# Patient Record
Sex: Male | Born: 1955 | Race: White | Hispanic: No | Marital: Single | Smoking: Never smoker
Health system: Southern US, Community
[De-identification: ages and names within clinical notes are randomized; demographics above are authoritative.]

---

## 2014-02-26 ENCOUNTER — Encounter: Payer: Self-pay | Admitting: Sports Medicine

## 2014-02-26 ENCOUNTER — Ambulatory Visit (INDEPENDENT_AMBULATORY_CARE_PROVIDER_SITE_OTHER): Payer: BC Managed Care – PPO

## 2014-02-26 ENCOUNTER — Ambulatory Visit (INDEPENDENT_AMBULATORY_CARE_PROVIDER_SITE_OTHER): Payer: BC Managed Care – PPO | Admitting: Sports Medicine

## 2014-02-26 VITALS — BP 155/96 | HR 65 | Ht 70.0 in | Wt 223.0 lb

## 2014-02-26 DIAGNOSIS — M2021 Hallux rigidus, right foot: Secondary | ICD-10-CM

## 2014-02-26 DIAGNOSIS — M25673 Stiffness of unspecified ankle, not elsewhere classified: Secondary | ICD-10-CM

## 2014-02-26 DIAGNOSIS — M79609 Pain in unspecified limb: Secondary | ICD-10-CM

## 2014-02-26 DIAGNOSIS — M2010 Hallux valgus (acquired), unspecified foot: Secondary | ICD-10-CM

## 2014-02-26 DIAGNOSIS — M25676 Stiffness of unspecified foot, not elsewhere classified: Secondary | ICD-10-CM

## 2014-02-26 MED ORDER — MELOXICAM 15 MG PO TABS: ORAL_TABLET | ORAL | Status: AC

## 2014-02-26 NOTE — Progress Notes (Signed)
   Subjective:    I'm seeing this patient as a consultation for:  Dr. Marvis RepressStanley Schaeffer  CC: Right foot pain  HPI: This is a pleasant 58 year old male runner, he comes in with a several month history of pain that he localizes at the plantar aspect of the right first metatarsophalangeal joint. He has developed rigidity, swelling, and what he describes as bone spurs all around the first metatarsophalangeal joint. Pain is moderate, persistent, he's been using ibuprofen and other over-the-counter NSAIDs without any response. No trauma.  Past medical history, Surgical history, Family history not pertinant except as noted below, Social history, Allergies, and medications have been entered into the medical record, reviewed, and no changes needed.   Review of Systems: No headache, visual changes, nausea, vomiting, diarrhea, constipation, dizziness, abdominal pain, skin rash, fevers, chills, night sweats, weight loss, swollen lymph nodes, body aches, joint swelling, muscle aches, chest pain, shortness of breath, mood changes, visual or auditory hallucinations.   Objective:   General: Well Developed, well nourished, and in no acute distress.  Neuro/Psych: Alert and oriented x3, extra-ocular muscles intact, able to move all 4 extremities, sensation grossly intact. Skin: Warm and dry, no rashes noted.  Respiratory: Not using accessory muscles, speaking in full sentences, trachea midline.  Cardiovascular: Pulses palpable, no extremity edema. Abdomen: Does not appear distended. Right Foot: No visible erythema or swelling. Range of motion is full in all directions. Strength is 5/5 in all directions. No hallux valgus. No pes cavus or pes planus. No abnormal callus noted. No pain over the navicular prominence, or base of fifth metatarsal. No tenderness to palpation of the calcaneal insertion of plantar fascia. No pain at the Achilles insertion. No pain over the calcaneal bursa. No pain of the  retrocalcaneal bursa. Tender to palpation over the first metatarsophalangeal joint, there is reproduction of pain with flexion of the first metatarsophalangeal joint. Hallux rigidus present. No tenderness palpation over interphalangeal joints. No pain with compression of the metatarsal heads. Neurovascularly intact distally.  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  Impression and Recommendations:   This case required medical decision making of moderate complexity.

## 2014-02-26 NOTE — Assessment & Plan Note (Signed)
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 X-rays, Mobic, return for custom orthotics with first metatarsal ray posting. If no better in one month after that, we can inject.

## 2014-03-14 ENCOUNTER — Encounter: Payer: Self-pay | Admitting: Sports Medicine

## 2014-03-14 ENCOUNTER — Ambulatory Visit (INDEPENDENT_AMBULATORY_CARE_PROVIDER_SITE_OTHER): Payer: BC Managed Care – PPO | Admitting: Sports Medicine

## 2014-03-14 VITALS — BP 139/80 | HR 61 | Ht 70.0 in | Wt 221.0 lb

## 2014-03-14 DIAGNOSIS — M2021 Hallux rigidus, right foot: Secondary | ICD-10-CM

## 2014-03-14 DIAGNOSIS — M25673 Stiffness of unspecified ankle, not elsewhere classified: Secondary | ICD-10-CM

## 2014-03-14 DIAGNOSIS — M25676 Stiffness of unspecified foot, not elsewhere classified: Secondary | ICD-10-CM

## 2014-03-14 NOTE — Assessment & Plan Note (Signed)
Custom orthotics as above. Pain-free. Return as needed.

## 2014-03-14 NOTE — Progress Notes (Signed)
    Patient was fitted for a : standard, cushioned, semi-rigid orthotic. The orthotic was heated and afterward the patient stood on the orthotic blank positioned on the orthotic stand. The patient was positioned in subtalar neutral position and 10 degrees of ankle dorsiflexion in a weight bearing stance. After completion of molding, a stable base was applied to the orthotic blank. The blank was ground to a stable position for weight bearing. Size: 12 Base: Blue EVA Additional Posting and Padding:  First metatarsal ray post bilaterally. The patient ambulated these, and they were very comfortable.  I spent 40 minutes with this patient, greater than 50% was face-to-face time counseling regarding the below diagnosis.

## 2014-10-29 IMAGING — CR DG FOOT COMPLETE 3+V*R*
3 series · 3 of 3 positions shown · non-contrast
Comparison: None.

CLINICAL DATA: Pain plantar aspect first metatarsal phalangeal
joint space since running half marathon November 2013.

EXAM:
RIGHT FOOT COMPLETE - 3+ VIEW

[view not recorded (1 of 3)]
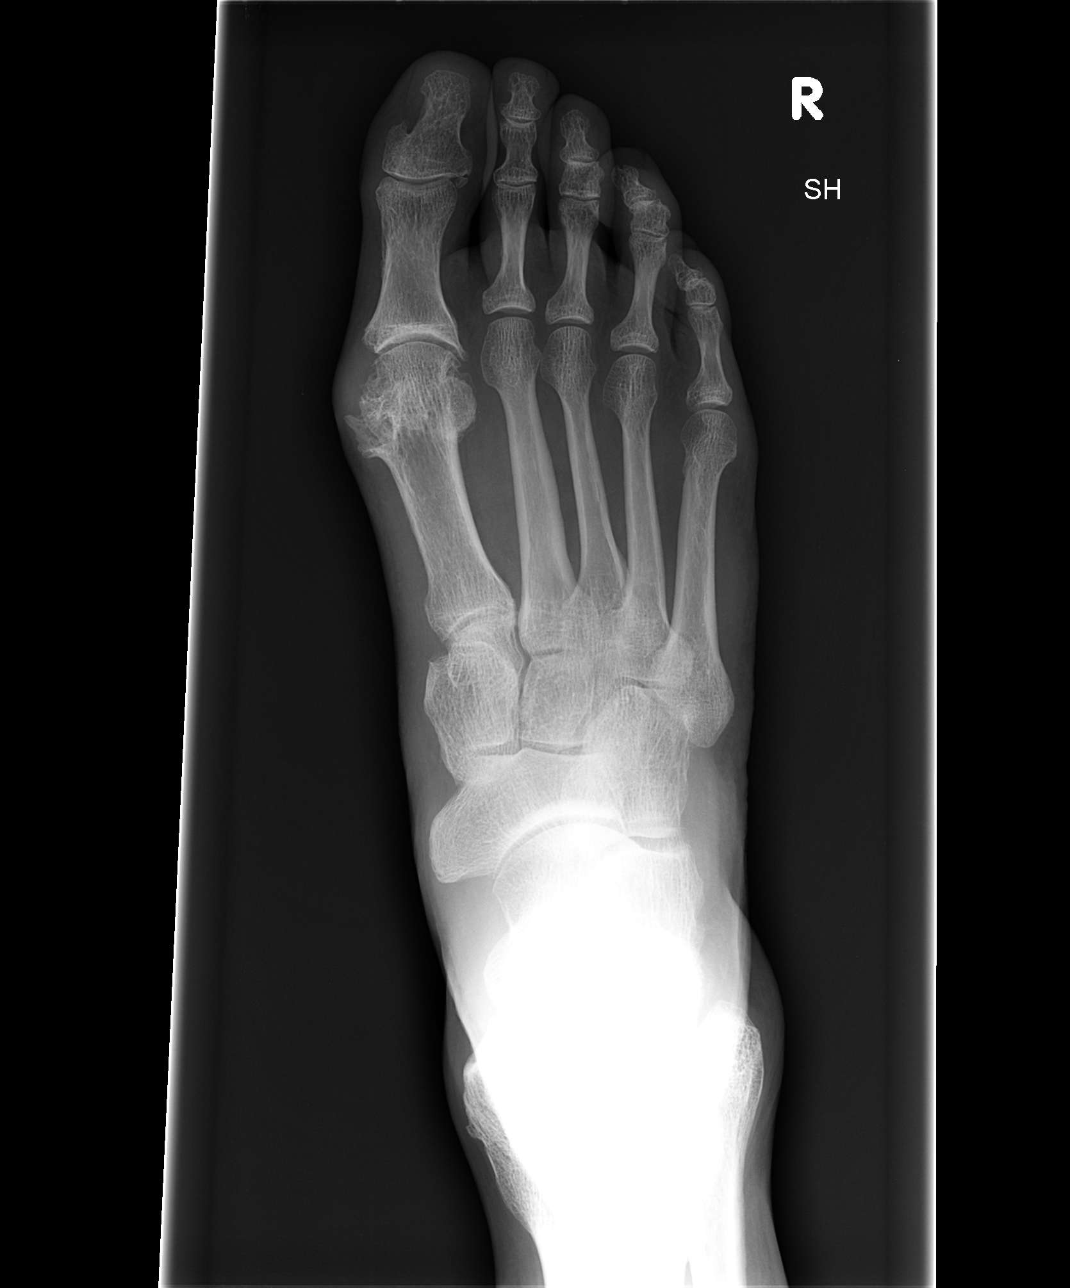

[view not recorded (2 of 3)]
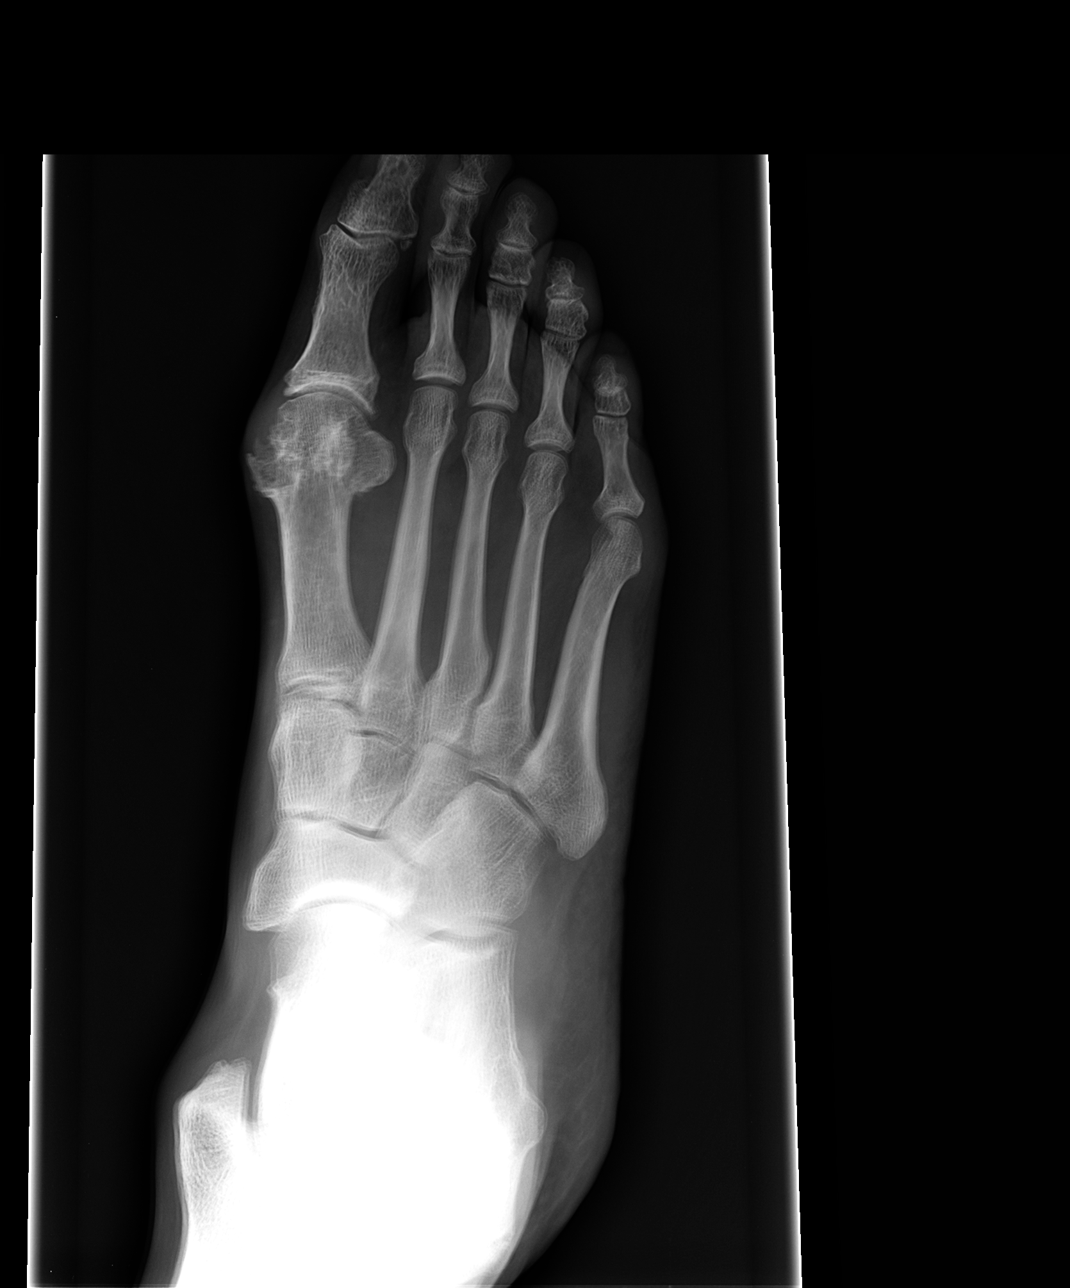

[view not recorded (3 of 3)]
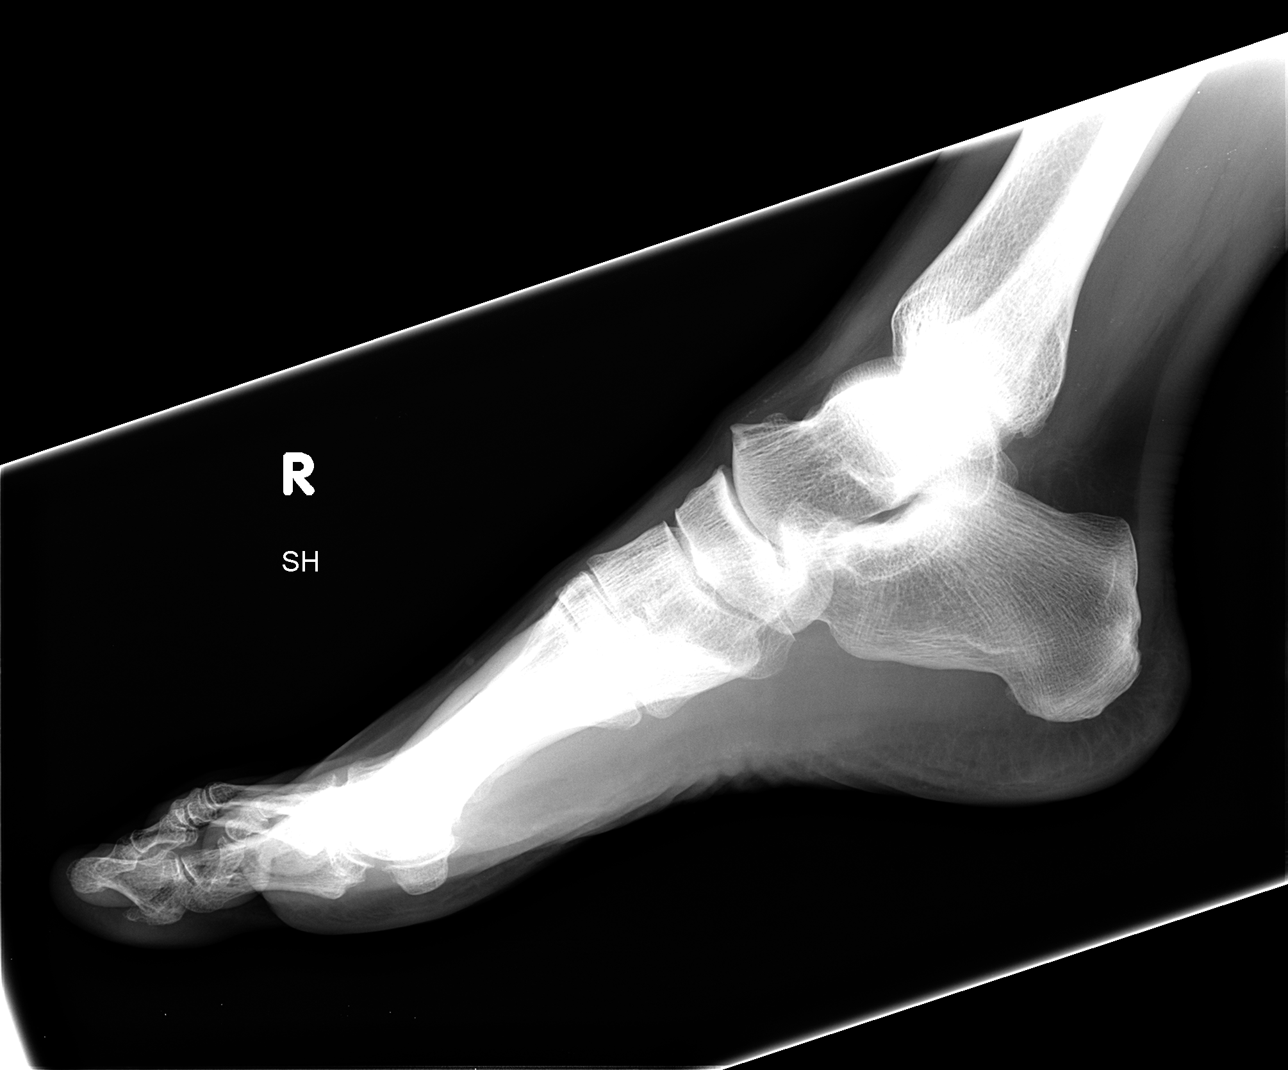

[3 of 3 positions shown; findings below may reference images not displayed]

FINDINGS: Oblique view is motion degraded.

No fracture or dislocation.

Prominent bony overgrowth / osteophyte right first metatarsal head.
No significant erosion adjacent to the bony overgrowth as is
typically seen with gout.

Less notable bony overgrowth involving the base of the right first
proximal phalanx.

Minimal hallux valgus deformity with soft tissue prominence medial
to the right first metatarsal phalangeal joint space.

Asymmetric appearance the sesamoids without discrete fracture or
significant sclerosis. Mild degenerative changes at the articulation
of the sesamoids with the right first metatarsal head.
IMPRESSION: Prominent degenerative changes right first metatarsal phalangeal
joint space as detailed above. Minimal hallux valgus.
